# Patient Record
Sex: Female | Born: 1970 | Race: White | Hispanic: No | Smoking: Never smoker
Health system: Southern US, Community
[De-identification: ages and names within clinical notes are randomized; demographics above are authoritative.]

---

## 2002-11-05 ENCOUNTER — Emergency Department (HOSPITAL_COMMUNITY): Admission: EM | Admit: 2002-11-05 | Discharge: 2002-11-05 | Payer: Self-pay

## 2003-10-31 ENCOUNTER — Emergency Department (HOSPITAL_COMMUNITY): Admission: EM | Admit: 2003-10-31 | Discharge: 2003-10-31 | Payer: Self-pay | Admitting: Emergency Medicine

## 2015-08-16 ENCOUNTER — Emergency Department (HOSPITAL_COMMUNITY): Payer: Self-pay

## 2015-08-16 ENCOUNTER — Encounter (HOSPITAL_COMMUNITY): Payer: Self-pay | Admitting: Emergency Medicine

## 2015-08-16 ENCOUNTER — Emergency Department (HOSPITAL_COMMUNITY)
Admission: EM | Admit: 2015-08-16 | Discharge: 2015-08-16 | Disposition: A | Discharge status: Home / Self Care | Payer: Self-pay | Attending: Emergency Medicine | Admitting: Emergency Medicine

## 2015-08-16 DIAGNOSIS — S99911A Unspecified injury of right ankle, initial encounter: Secondary | ICD-10-CM | POA: Insufficient documentation

## 2015-08-16 DIAGNOSIS — M545 Low back pain, unspecified: Secondary | ICD-10-CM

## 2015-08-16 DIAGNOSIS — Y998 Other external cause status: Secondary | ICD-10-CM | POA: Insufficient documentation

## 2015-08-16 DIAGNOSIS — S8991XA Unspecified injury of right lower leg, initial encounter: Secondary | ICD-10-CM | POA: Insufficient documentation

## 2015-08-16 DIAGNOSIS — W010XXA Fall on same level from slipping, tripping and stumbling without subsequent striking against object, initial encounter: Secondary | ICD-10-CM

## 2015-08-16 DIAGNOSIS — Y9389 Activity, other specified: Secondary | ICD-10-CM | POA: Insufficient documentation

## 2015-08-16 DIAGNOSIS — M25561 Pain in right knee: Secondary | ICD-10-CM

## 2015-08-16 DIAGNOSIS — Y9259 Other trade areas as the place of occurrence of the external cause: Secondary | ICD-10-CM | POA: Insufficient documentation

## 2015-08-16 DIAGNOSIS — S3992XA Unspecified injury of lower back, initial encounter: Secondary | ICD-10-CM | POA: Insufficient documentation

## 2015-08-16 DIAGNOSIS — Z3202 Encounter for pregnancy test, result negative: Secondary | ICD-10-CM | POA: Insufficient documentation

## 2015-08-16 DIAGNOSIS — M25571 Pain in right ankle and joints of right foot: Secondary | ICD-10-CM

## 2015-08-16 LAB — I-STAT BETA HCG BLOOD, ED (MC, WL, AP ONLY): I-stat hCG, quantitative: <5 m[IU]/mL (ref <5)

## 2015-08-16 NOTE — ED Provider Notes (Signed)
CSN: [id number]     Arrival date & time 08/16/15  1817 History  This chart was scribed for non-physician practitioner, Celene Skeen, PA-C working with Cathren Laine, MD by Doreatha Martin, ED scribe. This patient was seen in room WTR7/WTR7 and the patient's care was started at 6:35 PM    Chief Complaint  Patient presents with  . Knee Pain  . Fall   The history is provided by the patient. No language interpreter was used.    HPI Comments: Nina Ruiz is a 44 y.o. female who presents to the Emergency Department complaining of moderate 7/10 right knee pain that radiates down to the ankle and foot onset today after [place] the mall and landing on her leg just below the right knee. No head injury, no LOC. She states associated lower back pain. She reports that pain is worsened with movement and weight bearing and relieved with rest and ice. Pt has also had relief of pain post-arrival with ice compress. No treatments tried PTA. No bowel or bladder incontinence.    History reviewed. No pertinent past medical history. History reviewed. No pertinent past surgical history. History reviewed. No pertinent family history. Social History  Substance Use Topics  . Smoking status: Never Smoker   . Smokeless tobacco: None  . Alcohol Use: No   OB History    No data available     Review of Systems  Musculoskeletal: Positive for back pain and arthralgias.  All other systems reviewed and are negative.  Allergies  Review of patient's allergies indicates no known allergies.  Home Medications   Prior to Admission medications   Not on File   BP 123/68 mmHg  Pulse 77  Temp(Src) 98.2 F (36.8 C) (Oral)  Resp 16  SpO2 100%  LMP 07/08/2015 (Approximate) Physical Exam  Constitutional: She is oriented to person, place, and time. She appears well-developed and well-nourished. No distress.  HENT:  Head: Normocephalic and atraumatic.  Mouth/Throat: Oropharynx is clear and moist.  Eyes:  Conjunctivae are normal.  Neck: Normal range of motion. Neck supple. No spinous process tenderness and no muscular tenderness present.  Cardiovascular: Normal rate, regular rhythm and normal heart sounds.   Pulmonary/Chest: Effort normal and breath sounds normal. No respiratory distress.  Musculoskeletal:  Left knee TTP over tibial tuberosity and lateral to patella. No swelling, bruising or deformity. Range of motion limited due to pain. Mild pain down lower leg, increased pain at ankle throughout. No specific point tenderness. +2 PT/DP pulse. Full ankle range of motion with pain increased with flexion. TTP bilateral lumbar paraspinal muscles. No spinous process tenderness.  Neurological: She is alert and oriented to person, place, and time. She has normal strength.  Strength lower extremities 5/5 and equal bilateral. Sensation intact.  Skin: Skin is warm and dry. No rash noted. She is not diaphoretic.  Psychiatric: She has a normal mood and affect. Her behavior is normal.  Nursing note and vitals reviewed.   ED Course  Procedures (including critical care time) DIAGNOSTIC STUDIES: Oxygen Saturation is 99% on RA, normal by my interpretation.    COORDINATION OF CARE: 6:39 PM Discussed treatment plan with pt at bedside and pt agreed to plan.   Labs Review Labs Reviewed  I-STAT BETA HCG BLOOD, ED ([facility], [facility], [facility] ONLY)    Imaging Review Dg Lumbar Spine Complete  08/16/2015   CLINICAL DATA:  Fall at [facility]. Now with low back/lumbar spine tightness.  EXAM: LUMBAR SPINE - COMPLETE  4+ VIEW  COMPARISON:  Lumbar spine MRI 07/04/2005  FINDINGS: The alignment is maintained. Vertebral body heights are normal. There is no listhesis. The posterior elements are intact. Mild disc space narrowing at L5-S1, progressed from prior. No fracture. Sacroiliac joints are symmetric and normal.  IMPRESSION: 1. No fracture or subluxation of the lumbar spine. 2. Mild disc space narrowing at L5-S1.    Electronically Signed   By: Rubye Oaks M.D.   On: 08/16/2015 20:16   Dg Ankle Complete Right  08/16/2015   CLINICAL DATA:  Status post fall this evening. Right ankle pain. Initial encounter.  EXAM: RIGHT ANKLE - COMPLETE 3+ VIEW  COMPARISON:  None.  FINDINGS: There is no evidence of fracture, dislocation, or joint effusion. There is no evidence of arthropathy or other focal bone abnormality. Soft tissues are unremarkable.  IMPRESSION: Negative exam.   Electronically Signed   By: Drusilla Kanner M.D.   On: 08/16/2015 20:15   Dg Knee Complete 4 Views Right  08/16/2015   CLINICAL DATA:  Fall today with knee pain, initial encounter  EXAM: RIGHT KNEE - COMPLETE 4+ VIEW  COMPARISON:  None.  FINDINGS: There is no evidence of fracture, dislocation, or joint effusion. There is no evidence of arthropathy or other focal bone abnormality. Soft tissues are unremarkable.  IMPRESSION: No acute abnormality noted.   Electronically Signed   By: Alcide Clever M.D.   On: 08/16/2015 20:11   I have personally reviewed and evaluated these images and lab results as part of my medical decision-making.   EKG Interpretation None      MDM   Final diagnoses:  Fall from slip, trip, or stumble, initial encounter  Right knee pain  Right ankle pain  Bilateral low back pain without sciatica   Knee/ankle/back pain after mechanical fall. No bruising or signs of trauma. LE neurovascularly intact, no s/s central cord compression/cauda equina. Xrays pending. Pt offered pain medication which she defers at this time. Pt signed out to [employer], PA-C at shift change with xrays pending. Plan to d/c home if normal.   I personally performed the services described in this documentation, which was scribed in my presence. The recorded information has been reviewed and is accurate.  Kathrynn Speed, PA-C 08/17/15 [id number]  Cathren Laine, MD 08/18/15 [id number]

## 2015-08-16 NOTE — ED Notes (Signed)
Pt [place]. Pt fell on R knee and R flank. Pt complains of R knee/calf pain and R flank pain where she fell. Pt has not been able to bear weight on knee.

## 2015-08-16 NOTE — Discharge Instructions (Signed)
You may take over-the-counter medications such as ibuprofen, Tylenol or naproxen for pain. Follow up with your primary care physician within 1 week if no improvement.  Knee Pain The knee is the complex joint between your thigh and your lower leg. It is made up of bones, tendons, ligaments, and cartilage. The bones that make up the knee are:  The femur in the thigh.  The tibia and fibula in the lower leg.  The patella or kneecap riding in the groove on the lower femur. CAUSES  Knee pain is a common complaint with many causes. A few of these causes are:  Injury, such as:  A ruptured ligament or tendon injury.  Torn cartilage.  Medical conditions, such as:  Gout  Arthritis  Infections  Overuse, over training, or overdoing a physical activity. Knee pain can be minor or severe. Knee pain can accompany debilitating injury. Minor knee problems often respond well to self-care measures or get well on their own. More serious injuries may need medical intervention or even surgery. SYMPTOMS The knee is complex. Symptoms of knee problems can vary widely. Some of the problems are:  Pain with movement and weight bearing.  Swelling and tenderness.  Buckling of the knee.  Inability to straighten or extend your knee.  Your knee locks and you cannot straighten it.  Warmth and redness with pain and fever.  Deformity or dislocation of the kneecap. DIAGNOSIS  Determining what is wrong may be very straight forward such as when there is an injury. It can also be challenging because of the complexity of the knee. Tests to make a diagnosis may include:  Your caregiver taking a history and doing a physical exam.  Routine X-rays can be used to rule out other problems. X-rays will not reveal a cartilage tear. Some injuries of the knee can be diagnosed by:  Arthroscopy a surgical technique by which a small video camera is inserted through tiny incisions on the sides of the knee. This procedure  is used to examine and repair internal knee joint problems. Tiny instruments can be used during arthroscopy to repair the torn knee cartilage (meniscus).  Arthrography is a radiology technique. A contrast liquid is directly injected into the knee joint. Internal structures of the knee joint then become visible on X-ray film.  An MRI scan is a non X-ray radiology procedure in which magnetic fields and a computer produce two- or three-dimensional images of the inside of the knee. Cartilage tears are often visible using an MRI scanner. MRI scans have largely replaced arthrography in diagnosing cartilage tears of the knee.  Blood work.  Examination of the fluid that helps to lubricate the knee joint (synovial fluid). This is done by taking a sample out using a needle and a syringe. TREATMENT The treatment of knee problems depends on the cause. Some of these treatments are:  Depending on the injury, proper casting, splinting, surgery, or physical therapy care will be needed.  Give yourself adequate recovery time. Do not overuse your joints. If you begin to get sore during workout routines, back off. Slow down or do fewer repetitions.  For repetitive activities such as cycling or running, maintain your strength and nutrition.  Alternate muscle groups. For example, if you are a weight lifter, work the upper body on one day and the lower body the next.  Either tight or weak muscles do not give the proper support for your knee. Tight or weak muscles do not absorb the stress placed on the  knee joint. Keep the muscles surrounding the knee strong.  Take care of mechanical problems.  If you have flat feet, orthotics or special shoes may help. See your caregiver if you need help.  Arch supports, sometimes with wedges on the inner or outer aspect of the heel, can help. These can shift pressure away from the side of the knee most bothered by osteoarthritis.  A brace called an "unloader" brace also may be  used to help ease the pressure on the most arthritic side of the knee.  If your caregiver has prescribed crutches, braces, wraps or ice, use as directed. The acronym for this is PRICE. This means protection, rest, ice, compression, and elevation.  Nonsteroidal anti-inflammatory drugs (NSAIDs), can help relieve pain. But if taken immediately after an injury, they may actually increase swelling. Take NSAIDs with food in your stomach. Stop them if you develop stomach problems. Do not take these if you have a history of ulcers, stomach pain, or bleeding from the bowel. Do not take without your caregiver's approval if you have problems with fluid retention, heart failure, or kidney problems.  For ongoing knee problems, physical therapy may be helpful.  Glucosamine and chondroitin are over-the-counter dietary supplements. Both may help relieve the pain of osteoarthritis in the knee. These medicines are different from the usual anti-inflammatory drugs. Glucosamine may decrease the rate of cartilage destruction.  Injections of a corticosteroid drug into your knee joint may help reduce the symptoms of an arthritis flare-up. They may provide pain relief that lasts a few months. You may have to wait a few months between injections. The injections do have a small increased risk of infection, water retention, and elevated blood sugar levels.  Hyaluronic acid injected into damaged joints may ease pain and provide lubrication. These injections may work by reducing inflammation. A series of shots may give relief for as long as 6 months.  Topical painkillers. Applying certain ointments to your skin may help relieve the pain and stiffness of osteoarthritis. Ask your pharmacist for suggestions. Many over the-counter products are approved for temporary relief of arthritis pain.  In some countries, doctors often prescribe topical NSAIDs for relief of chronic conditions such as arthritis and tendinitis. A review of  treatment with NSAID creams found that they worked as well as oral medications but without the serious side effects. PREVENTION  Maintain a healthy weight. Extra pounds put more strain on your joints.  Get strong, stay limber. Weak muscles are a common cause of knee injuries. Stretching is important. Include flexibility exercises in your workouts.  Be smart about exercise. If you have osteoarthritis, chronic knee pain or recurring injuries, you may need to change the way you exercise. This does not mean you have to stop being active. If your knees ache after jogging or playing basketball, consider switching to swimming, water aerobics, or other low-impact activities, at least for a few days a week. Sometimes limiting high-impact activities will provide relief.  Make sure your shoes fit well. Choose footwear that is right for your sport.  Protect your knees. Use the proper gear for knee-sensitive activities. Use kneepads when playing volleyball or laying carpet. Buckle your seat belt every time you drive. Most shattered kneecaps occur in car accidents.  Rest when you are tired. SEEK MEDICAL CARE IF:  You have knee pain that is continual and does not seem to be getting better.  SEEK IMMEDIATE MEDICAL CARE IF:  Your knee joint feels hot to the touch and you  have a high fever. MAKE SURE YOU:   Understand these instructions.  Will watch your condition.  Will get help right away if you are not doing well or get worse. Document Released: 09/04/2007 Document Revised: 01/30/2012 Document Reviewed: 09/04/2007 [facility] Patient Information 2015 [place], [place]. This information is not intended to replace advice given to you by your health care provider. Make sure you discuss any questions you have with your health care provider.  Ankle Pain Ankle pain is a common symptom. The bones, cartilage, tendons, and muscles of the ankle joint perform a lot of work each day. The ankle joint holds your body  weight and allows you to move around. Ankle pain can occur on either side or back of 1 or both ankles. Ankle pain may be sharp and burning or dull and aching. There may be tenderness, stiffness, redness, or warmth around the ankle. The pain occurs more often when a person walks or puts pressure on the ankle. CAUSES  There are many reasons ankle pain can develop. It is important to work with your caregiver to identify the cause since many conditions can impact the bones, cartilage, muscles, and tendons. Causes for ankle pain include:  Injury, including a break (fracture), sprain, or strain often due to a fall, sports, or a high-impact activity.  Swelling (inflammation) of a tendon (tendonitis).  Achilles tendon rupture.  Ankle instability after repeated sprains and strains.  Poor foot alignment.  Pressure on a nerve (tarsal tunnel syndrome).  Arthritis in the ankle or the lining of the ankle.  Crystal formation in the ankle (gout or pseudogout). DIAGNOSIS  A diagnosis is based on your medical history, your symptoms, results of your physical exam, and results of diagnostic tests. Diagnostic tests may include X-ray exams or a computerized magnetic scan (magnetic resonance imaging, MRI). TREATMENT  Treatment will depend on the cause of your ankle pain and may include:  Keeping pressure off the ankle and limiting activities.  Using crutches or other walking support (a cane or brace).  Using rest, ice, compression, and elevation.  Participating in physical therapy or home exercises.  Wearing shoe inserts or special shoes.  Losing weight.  Taking medications to reduce pain or swelling or receiving an injection.  Undergoing surgery. HOME CARE INSTRUCTIONS   Only take over-the-counter or prescription medicines for pain, discomfort, or fever as directed by your caregiver.  Put ice on the injured area.  Put ice in a plastic bag.  Place a towel between your skin and the  bag.  Leave the ice on for 15-20 minutes at a time, 03-04 times a day.  Keep your leg raised (elevated) when possible to lessen swelling.  Avoid activities that cause ankle pain.  Follow specific exercises as directed by your caregiver.  Record how often you have ankle pain, the location of the pain, and what it feels like. This information may be helpful to you and your caregiver.  Ask your caregiver about returning to work or sports and whether you should drive.  Follow up with your caregiver for further examination, therapy, or testing as directed. SEEK MEDICAL CARE IF:   Pain or swelling continues or worsens beyond 1 week.  You have an oral temperature above 102 F (38.9 C).  You are feeling unwell or have chills.  You are having an increasingly difficult time with walking.  You have loss of sensation or other new symptoms.  You have questions or concerns. MAKE SURE YOU:   Understand these instructions.  Will watch your condition.  Will get help right away if you are not doing well or get worse. Document Released: 04/27/2010 Document Revised: 01/30/2012 Document Reviewed: 04/27/2010 [facility] Patient Information 2015 [place], [place]. This information is not intended to replace advice given to you by your health care provider. Make sure you discuss any questions you have with your health care provider.  Back Pain, Adult Low back pain is very common. About 1 in 5 people have back pain.The cause of low back pain is rarely dangerous. The pain often gets better over time.About half of people with a sudden onset of back pain feel better in just 2 weeks. About 8 in 10 people feel better by 6 weeks.  CAUSES Some common causes of back pain include:  Strain of the muscles or ligaments supporting the spine.  Wear and tear (degeneration) of the spinal discs.  Arthritis.  Direct injury to the back. DIAGNOSIS Most of the time, the direct cause of low back pain is not  known.However, back pain can be treated effectively even when the exact cause of the pain is unknown.Answering your caregiver's questions about your overall health and symptoms is one of the most accurate ways to make sure the cause of your pain is not dangerous. If your caregiver needs more information, he or she may order lab work or imaging tests (X-rays or MRIs).However, even if imaging tests show changes in your back, this usually does not require surgery. HOME CARE INSTRUCTIONS For many people, back pain returns.Since low back pain is rarely dangerous, it is often a condition that people can learn to [facility] their own.   Remain active. It is stressful on the back to sit or stand in one place. Do not sit, drive, or stand in one place for more than 30 minutes at a time. Take short walks on level surfaces as soon as pain allows.Try to increase the length of time you walk each day.  Do not stay in bed.Resting more than 1 or 2 days can delay your recovery.  Do not avoid exercise or work.Your body is made to move.It is not dangerous to be active, even though your back may hurt.Your back will likely heal faster if you return to being active before your pain is gone.  Pay attention to your body when you bend and lift. Many people have less discomfortwhen lifting if they bend their knees, keep the load close to their bodies,and avoid twisting. Often, the most comfortable positions are those that put less stress on your recovering back.  Find a comfortable position to sleep. Use a firm mattress and lie on your side with your knees slightly bent. If you lie on your back, put a pillow under your knees.  Only take over-the-counter or prescription medicines as directed by your caregiver. Over-the-counter medicines to reduce pain and inflammation are often the most helpful.Your caregiver may prescribe muscle relaxant drugs.These medicines help dull your pain so you can more quickly return to  your normal activities and healthy exercise.  Put ice on the injured area.  Put ice in a plastic bag.  Place a towel between your skin and the bag.  Leave the ice on for 15-20 minutes, 03-04 times a day for the first 2 to 3 days. After that, ice and heat may be alternated to reduce pain and spasms.  Ask your caregiver about trying back exercises and gentle massage. This may be of some benefit.  Avoid feeling anxious or stressed.Stress increases muscle tension  and can worsen back pain.It is important to recognize when you are anxious or stressed and learn ways to manage it.Exercise is a great option. SEEK MEDICAL CARE IF:  You have pain that is not relieved with rest or medicine.  You have pain that does not improve in 1 week.  You have new symptoms.  You are generally not feeling well. SEEK IMMEDIATE MEDICAL CARE IF:   You have pain that radiates from your back into your legs.  You develop new bowel or bladder control problems.  You have unusual weakness or numbness in your arms or legs.  You develop nausea or vomiting.  You develop abdominal pain.  You feel faint. Document Released: 11/07/2005 Document Revised: 05/08/2012 Document Reviewed: 03/11/2014 [facility] Patient Information 2015 [place], [place]. This information is not intended to replace advice given to you by your health care provider. Make sure you discuss any questions you have with your health care provider.

## 2016-05-23 IMAGING — CR DG LUMBAR SPINE COMPLETE 4+V
5 series · 5 of 5 positions shown · non-contrast
Comparison: Lumbar spine MRI 07/04/2005

CLINICAL DATA: Fall at [facility] [REDACTED]'s tonight. Now with low
back/lumbar spine tightness.

EXAM:
LUMBAR SPINE - COMPLETE 4+ VIEW

[t lumbar spine ap]
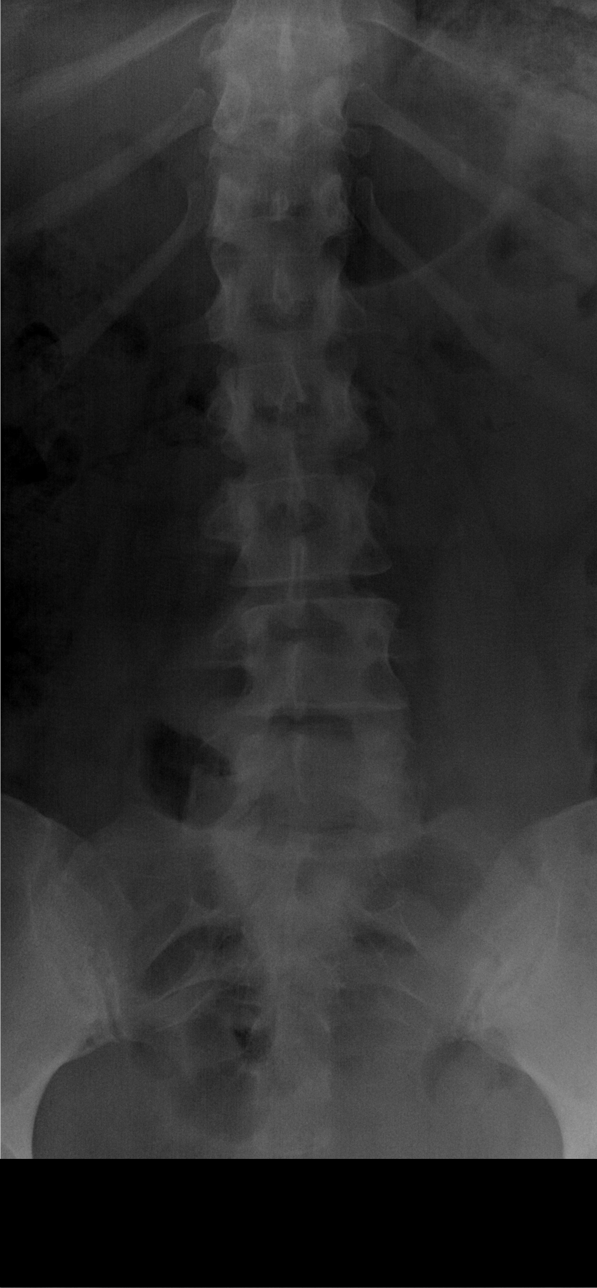

[t lumbar spine obl (1 of 2)]
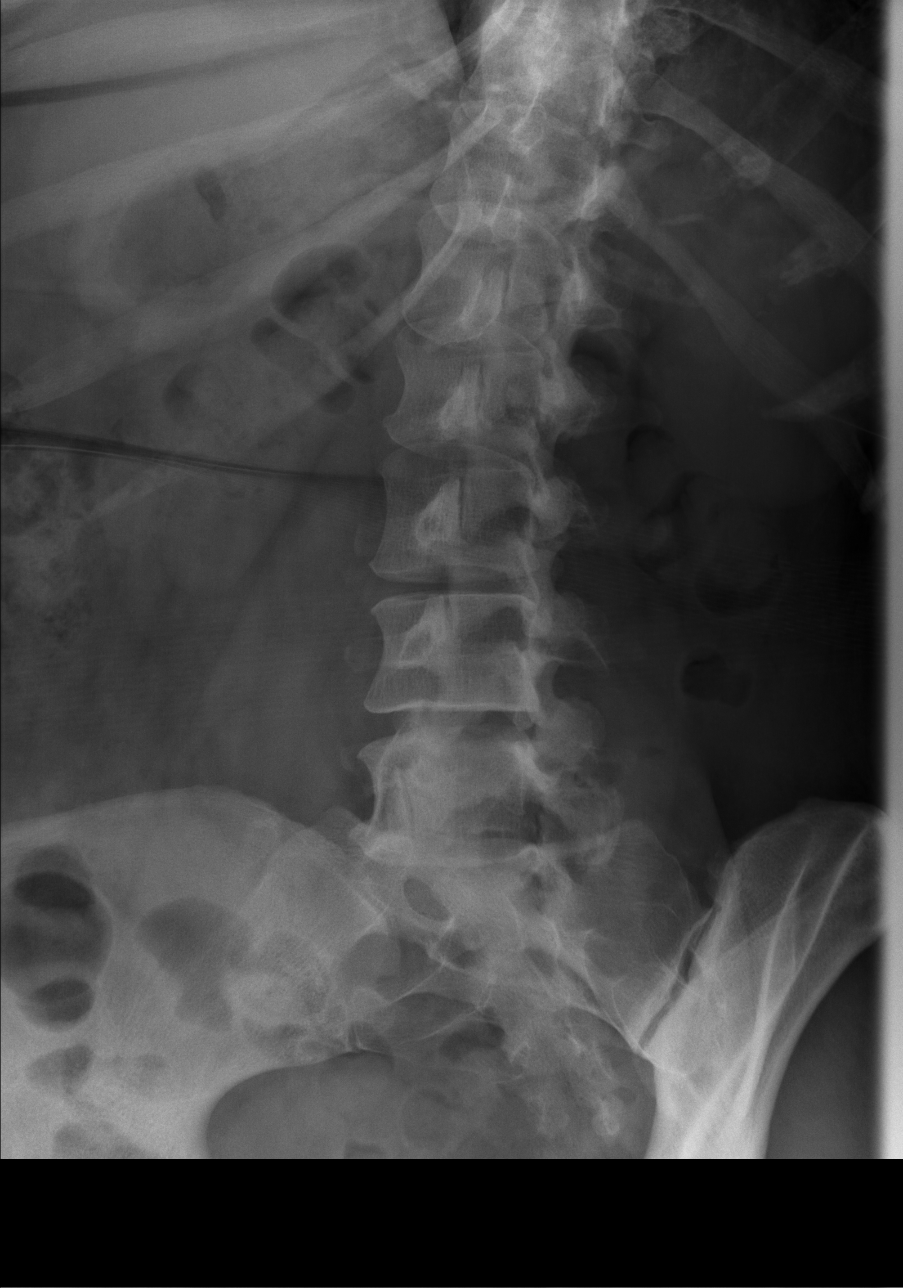

[t lumbar spine obl (2 of 2)]
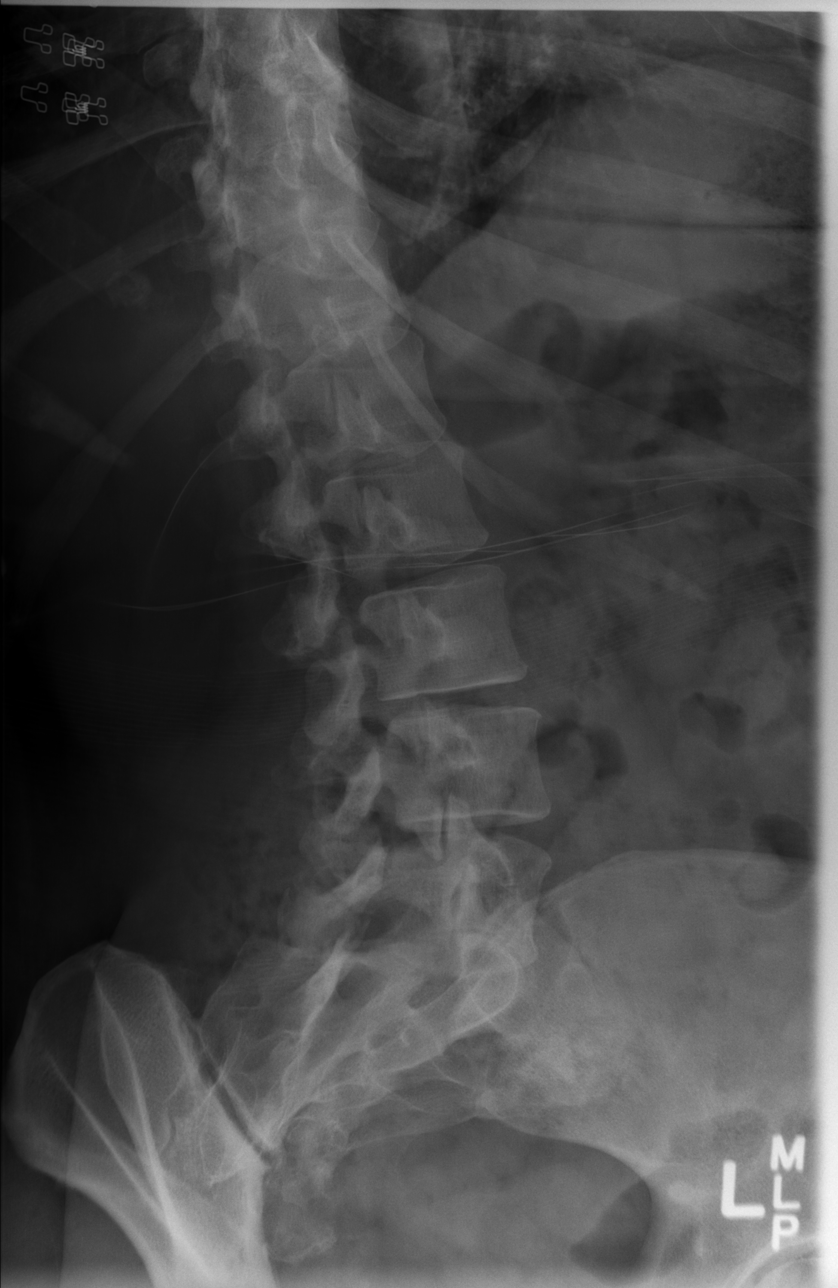

[t lumbar spine lat]
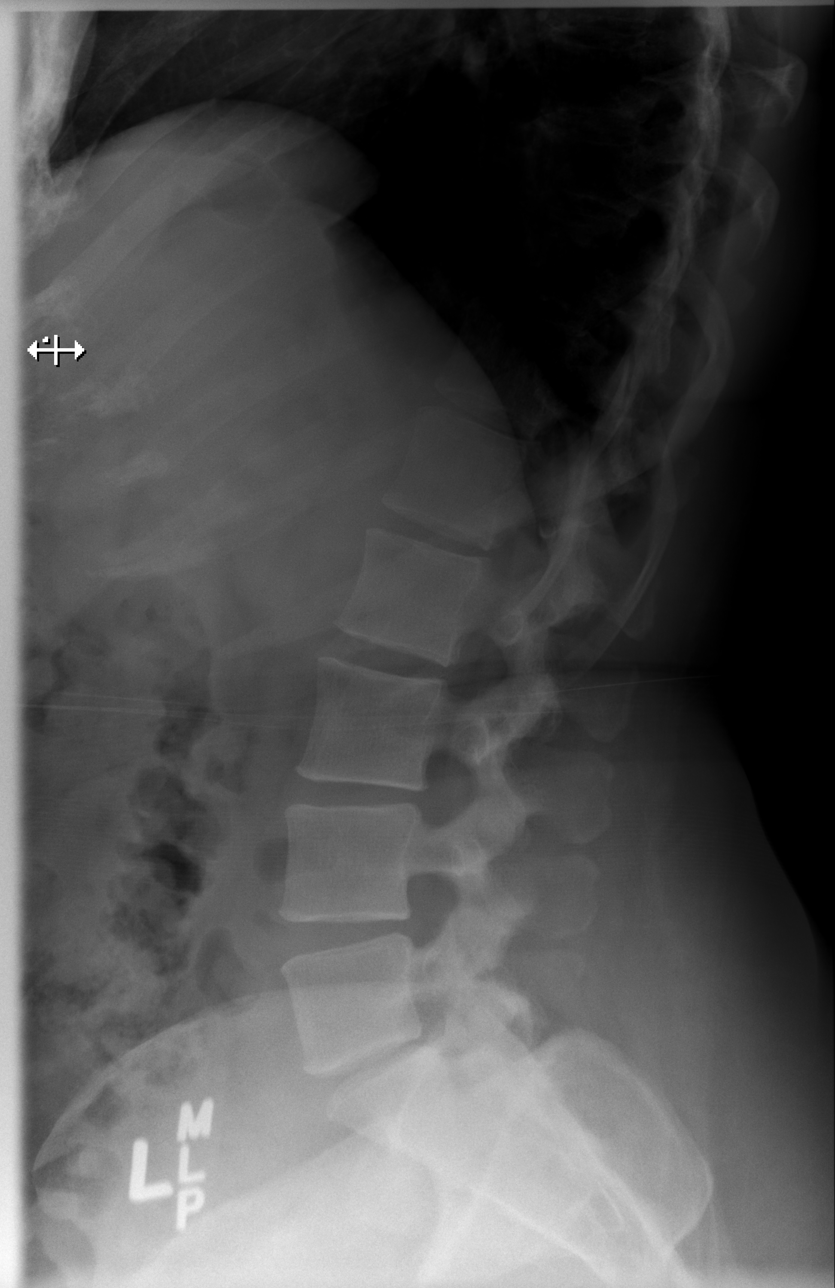

[t lumbar l-5 s-1 spot]
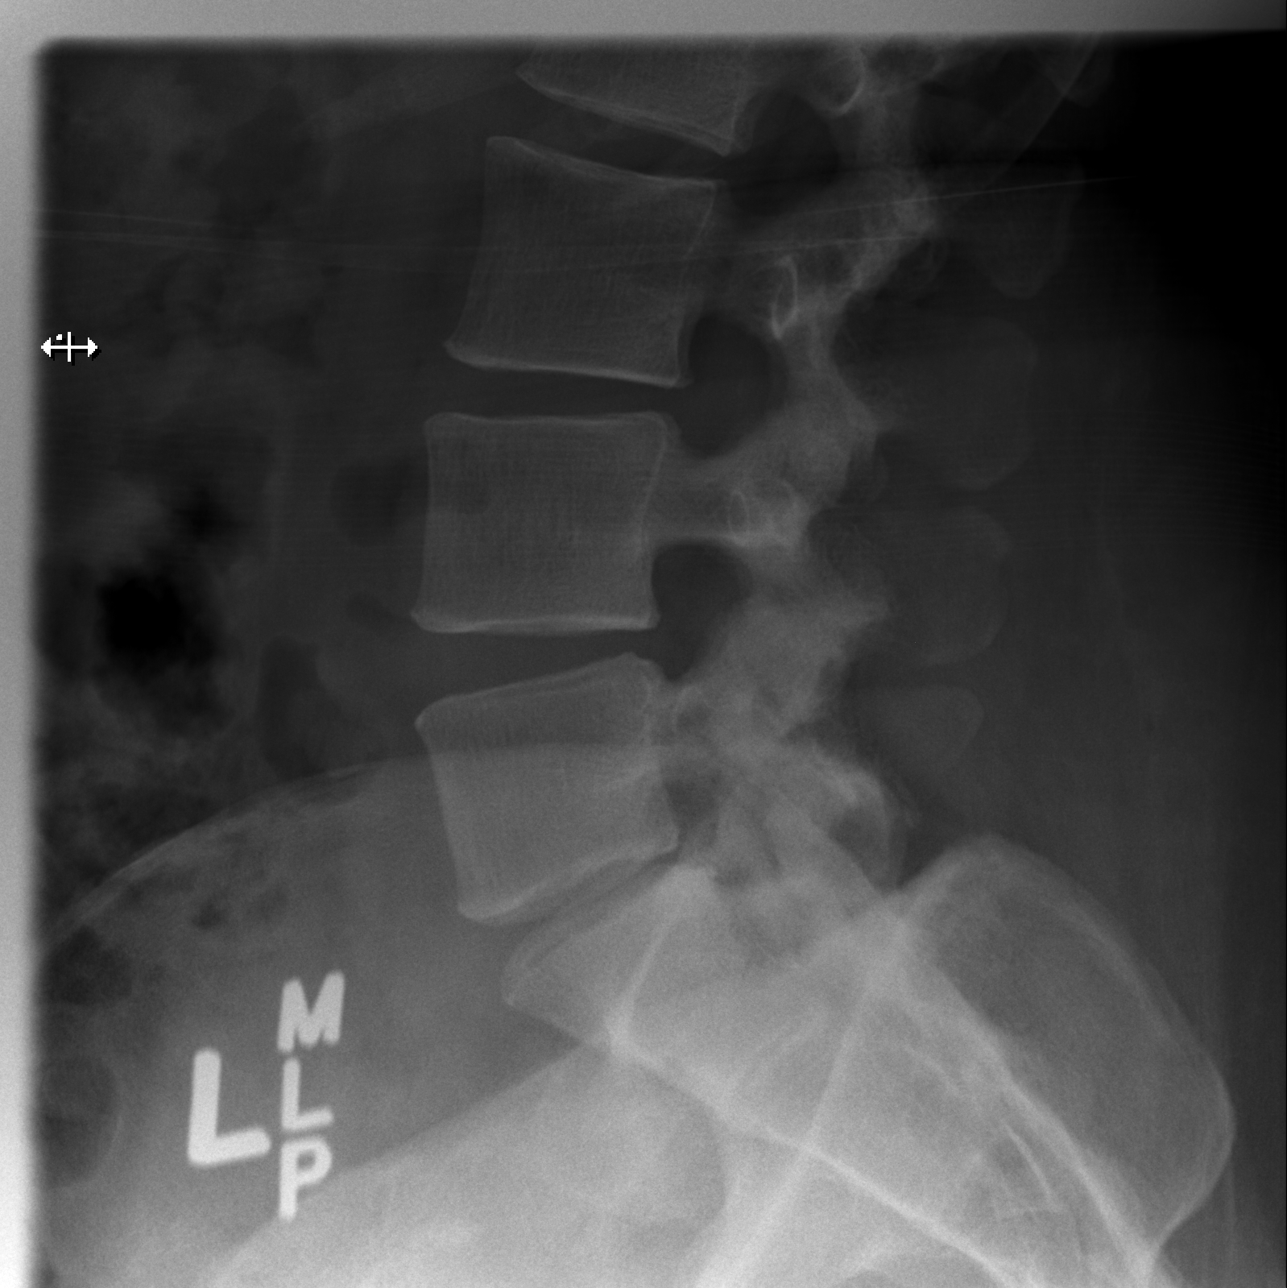

[5 of 5 positions shown; findings below may reference images not displayed]

FINDINGS: The alignment is maintained. Vertebral body heights are normal.
There is no listhesis. The posterior elements are intact. Mild disc
space narrowing at L5-S1, progressed from prior. No fracture.
Sacroiliac joints are symmetric and normal.
IMPRESSION: 1. No fracture or subluxation of the lumbar spine.
2. Mild disc space narrowing at L5-S1.
